# Patient Record
Sex: Female | Born: 1945 | Race: White | Hispanic: No | Smoking: Never smoker
Health system: Southern US, Community
[De-identification: ages and names within clinical notes are randomized; demographics above are authoritative.]

## PROBLEM LIST (undated history)

## (undated) HISTORY — PX: TONSILLECTOMY: SUR1361

## (undated) HISTORY — PX: APPENDECTOMY: SHX54

---

## 2019-09-24 DIAGNOSIS — J189 Pneumonia, unspecified organism: Secondary | ICD-10-CM

## 2019-09-24 DIAGNOSIS — E876 Hypokalemia: Secondary | ICD-10-CM | POA: Diagnosis not present

## 2019-09-24 DIAGNOSIS — U071 COVID-19: Secondary | ICD-10-CM

## 2019-09-24 DIAGNOSIS — R0902 Hypoxemia: Secondary | ICD-10-CM

## 2019-09-24 DIAGNOSIS — E871 Hypo-osmolality and hyponatremia: Secondary | ICD-10-CM

## 2019-09-25 DIAGNOSIS — E876 Hypokalemia: Secondary | ICD-10-CM | POA: Diagnosis not present

## 2019-09-25 DIAGNOSIS — R0902 Hypoxemia: Secondary | ICD-10-CM | POA: Diagnosis not present

## 2019-09-25 DIAGNOSIS — U071 COVID-19: Secondary | ICD-10-CM | POA: Diagnosis not present

## 2019-09-25 DIAGNOSIS — E871 Hypo-osmolality and hyponatremia: Secondary | ICD-10-CM | POA: Diagnosis not present

## 2019-09-26 ENCOUNTER — Other Ambulatory Visit: Payer: Self-pay

## 2019-09-26 ENCOUNTER — Encounter (HOSPITAL_COMMUNITY): Payer: Self-pay | Admitting: Internal Medicine

## 2019-09-26 ENCOUNTER — Inpatient Hospital Stay (HOSPITAL_COMMUNITY)
Admission: AD | Admit: 2019-09-26 | Discharge: 2019-09-29 | DRG: 177 | Disposition: A | Discharge status: Home / Self Care | Payer: Medicare Other | Source: Other Acute Inpatient Hospital | Attending: Internal Medicine | Admitting: Internal Medicine

## 2019-09-26 ENCOUNTER — Inpatient Hospital Stay (HOSPITAL_COMMUNITY): Payer: Medicare Other

## 2019-09-26 DIAGNOSIS — Z6835 Body mass index (BMI) 35.0-35.9, adult: Secondary | ICD-10-CM | POA: Diagnosis not present

## 2019-09-26 DIAGNOSIS — E669 Obesity, unspecified: Secondary | ICD-10-CM | POA: Diagnosis present

## 2019-09-26 DIAGNOSIS — J9601 Acute respiratory failure with hypoxia: Secondary | ICD-10-CM | POA: Diagnosis present

## 2019-09-26 DIAGNOSIS — J1282 Pneumonia due to coronavirus disease 2019: Secondary | ICD-10-CM | POA: Diagnosis present

## 2019-09-26 DIAGNOSIS — E876 Hypokalemia: Secondary | ICD-10-CM | POA: Diagnosis present

## 2019-09-26 DIAGNOSIS — R110 Nausea: Secondary | ICD-10-CM | POA: Diagnosis present

## 2019-09-26 DIAGNOSIS — E871 Hypo-osmolality and hyponatremia: Secondary | ICD-10-CM | POA: Diagnosis present

## 2019-09-26 DIAGNOSIS — R0600 Dyspnea, unspecified: Secondary | ICD-10-CM

## 2019-09-26 DIAGNOSIS — U071 COVID-19: Secondary | ICD-10-CM | POA: Diagnosis present

## 2019-09-26 DIAGNOSIS — E861 Hypovolemia: Secondary | ICD-10-CM | POA: Diagnosis present

## 2019-09-26 DIAGNOSIS — R0902 Hypoxemia: Secondary | ICD-10-CM | POA: Diagnosis not present

## 2019-09-26 LAB — CBC WITH DIFFERENTIAL/PLATELET
Abs Immature Granulocytes: 0.1 10*3/uL — ABNORMAL HIGH (ref 0.00–0.07)
Basophils Absolute: 0 10*3/uL (ref 0.0–0.1)
Basophils Relative: 0 %
Eosinophils Absolute: 0 10*3/uL (ref 0.0–0.5)
Eosinophils Relative: 0 %
HCT: 41.7 % (ref 36.0–46.0)
Hemoglobin: 14.1 g/dL (ref 12.0–15.0)
Immature Granulocytes: 1 %
Lymphocytes Relative: 16 %
Lymphs Abs: 1.1 10*3/uL (ref 0.7–4.0)
MCH: 29.7 pg (ref 26.0–34.0)
MCHC: 33.8 g/dL (ref 30.0–36.0)
MCV: 88 fL (ref 80.0–100.0)
Monocytes Absolute: 0.6 10*3/uL (ref 0.1–1.0)
Monocytes Relative: 8 %
Neutro Abs: 5.2 10*3/uL (ref 1.7–7.7)
Neutrophils Relative %: 75 %
Platelets: 470 10*3/uL — ABNORMAL HIGH (ref 150–400)
RBC: 4.74 MIL/uL (ref 3.87–5.11)
RDW: 12.5 % (ref 11.5–15.5)
WBC: 6.9 10*3/uL (ref 4.0–10.5)
nRBC: 0 % (ref 0.0–0.2)

## 2019-09-26 LAB — COMPREHENSIVE METABOLIC PANEL
ALT: 21 U/L (ref 0–44)
AST: 29 U/L (ref 15–41)
Albumin: 3.1 g/dL — ABNORMAL LOW (ref 3.5–5.0)
Alkaline Phosphatase: 57 U/L (ref 38–126)
Anion gap: 11 (ref 5–15)
BUN: 16 mg/dL (ref 8–23)
CO2: 25 mmol/L (ref 22–32)
Calcium: 8.3 mg/dL — ABNORMAL LOW (ref 8.9–10.3)
Chloride: 97 mmol/L — ABNORMAL LOW (ref 98–111)
Creatinine, Ser: 0.64 mg/dL (ref 0.44–1.00)
GFR calc Af Amer: >60 mL/min (ref >60)
GFR calc non Af Amer: >60 mL/min (ref >60)
Glucose, Bld: 100 mg/dL — ABNORMAL HIGH (ref 70–99)
Potassium: 3.6 mmol/L (ref 3.5–5.1)
Sodium: 133 mmol/L — ABNORMAL LOW (ref 135–145)
Total Bilirubin: 0.4 mg/dL (ref 0.3–1.2)
Total Protein: 6.6 g/dL (ref 6.5–8.1)

## 2019-09-26 LAB — C-REACTIVE PROTEIN: CRP: 7.2 mg/dL — ABNORMAL HIGH (ref <1.0)

## 2019-09-26 LAB — SARS CORONAVIRUS 2 (TAT 6-24 HRS): SARS Coronavirus 2: POSITIVE — AB

## 2019-09-26 LAB — D-DIMER, QUANTITATIVE: D-Dimer, Quant: 0.65 ug/mL-FEU — ABNORMAL HIGH (ref 0.00–0.50)

## 2019-09-26 LAB — ABO/RH: ABO/RH(D): O POS

## 2019-09-26 MED ORDER — ACETAMINOPHEN 325 MG PO TABS: 650.0000 mg | ORAL_TABLET | Freq: Four times a day (QID) | ORAL | Status: DC | PRN

## 2019-09-26 MED ORDER — ENOXAPARIN SODIUM 40 MG/0.4ML ~~LOC~~ SOLN
40.0000 mg | SUBCUTANEOUS | Status: DC
  Administered 2019-09-26 – 2019-09-28 (×3): 40 mg via SUBCUTANEOUS
  Filled 2019-09-26 (×3): qty 0.4

## 2019-09-26 MED ORDER — SODIUM CHLORIDE 0.9 % IV SOLN: 100.0000 mg | Freq: Every day | INTRAVENOUS | Status: DC

## 2019-09-26 MED ORDER — SODIUM CHLORIDE 0.9% IV SOLUTION
Freq: Once | INTRAVENOUS | Status: AC
  Administered 2019-09-26: 10:00:00 10 mL/h via INTRAVENOUS

## 2019-09-26 MED ORDER — SODIUM CHLORIDE 0.9 % IV SOLN: 200.0000 mg | Freq: Once | INTRAVENOUS | Status: DC

## 2019-09-26 MED ORDER — ONDANSETRON HCL 4 MG/2ML IJ SOLN: 4.0000 mg | Freq: Four times a day (QID) | INTRAMUSCULAR | Status: DC | PRN

## 2019-09-26 MED ORDER — ONDANSETRON HCL 4 MG PO TABS: 4.0000 mg | ORAL_TABLET | Freq: Four times a day (QID) | ORAL | Status: DC | PRN

## 2019-09-26 MED ORDER — METHYLPREDNISOLONE SODIUM SUCC 125 MG IJ SOLR
0.5000 mg/kg | Freq: Two times a day (BID) | INTRAMUSCULAR | Status: DC
  Administered 2019-09-26 – 2019-09-28 (×5): 45.625 mg via INTRAVENOUS
  Filled 2019-09-26 (×5): qty 2

## 2019-09-26 MED ORDER — SODIUM CHLORIDE 0.9 % IV SOLN
100.0000 mg | Freq: Every day | INTRAVENOUS | Status: AC
  Administered 2019-09-26 – 2019-09-28 (×3): 100 mg via INTRAVENOUS
  Filled 2019-09-26 (×3): qty 20

## 2019-09-26 MED ORDER — POLYETHYLENE GLYCOL 3350 17 G PO PACK
17.0000 g | PACK | Freq: Every day | ORAL | Status: DC | PRN
  Administered 2019-09-27: 10:00:00 17 g via ORAL
  Filled 2019-09-26: qty 1

## 2019-09-26 NOTE — Plan of Care (Signed)
Care plan initiated.

## 2019-09-26 NOTE — Progress Notes (Signed)
Writer in touch with patient daughter Narda Bonds she has been updated on patient condition per patient request. She did inquire about the purpose of plasma administration, and she has been educated. She states that she understands and is very grateful for the communication.

## 2019-09-26 NOTE — Progress Notes (Addendum)
Pt arrived on 10LNRB with sats @ 89% via EMS from [facility]. On arrival, pt resting comfortably on stretcher. Transferred easily to bed.   Pt now on 15LNRB; vitals taken; CHG bath given, skin check completed with 2nd RN; purewick placed; tele placed and monitor room made aware of pt arrival.  MD in room at this time; will put in orders after assessment.

## 2019-09-26 NOTE — H&P (Signed)
History and Physical    Ashley Montes JJO:[id number] DOB: [birth year] DOA: 09/26/2019  PCP: No primary care provider on file.  Patient coming from: Home  I have personally briefly reviewed patient's old medical records in [facility]  Chief Complaint: Worsening shortness of breath  HPI: Ashley Montes is a 74 y.o. female with no significant past medical history presents to the ED with 1 week of malaise and nausea dry heaving and decreased oral intake secondary to nausea.  Accompanied as she is also relates a dry cough.  She relates she has been having shortness of breath for the past couple of days prior to admission.  She reports a low-grade fever resolved a lot tested positive for COVID-19 last week.  On arrival to the ED at [facility] she was satting 82% on room air was placed on 2 L improved to 94.  She was tachycardic hypotensive with a white count of 7.1 hemoglobin of 13, sodium 130, potassium 3.2 influenza and RSV were negative procalcitonin was 0.05 as her SARS-CoV-2 PCR was positive.  Hospital course: She was admitted on 09/24/2019 due to Covid pneumonia placed on 2 L of oxygen and overnight her oxygen requirements progressively got worse, at the time of transfer she was on 10 L of oxygen, she was started on IV remdesivir and steroids and was given a single dose of Actemra and transferred to [facility] due to deterioration in her respiratory status.  Review of Systems: As per HPI otherwise 10 point review of systems negative.   No past medical history on file.     reports that she has never smoked. She has never used smokeless tobacco. No history on file for alcohol and drug.  Not on File  Family History  Problem Relation Age of Onset  . Heart attack Mother   . Diabetes Mellitus I Mother   . Hypertension Father      Prior to Admission medications   Not on File    Physical Exam: Vitals:   09/26/19 0613 09/26/19 0700 09/26/19 0850  BP: (!) 141/77  140/69    Pulse: 92  93  Resp:   (!) 22  Temp:   98.9 F (37.2 C)  TempSrc:   Oral  SpO2: 98%  90%  Weight:  90.7 kg     Constitutional: NAD, calm, comfortable Vitals:   09/26/19 0613 09/26/19 0700 09/26/19 0850  BP: (!) 141/77  140/69  Pulse: 92  93  Resp:   (!) 22  Temp:   98.9 F (37.2 C)  TempSrc:   Oral  SpO2: 98%  90%  Weight:  90.7 kg    Eyes: PERRL, lids and conjunctivae normal ENMT: Mucous membranes are moist. Posterior pharynx clear of any exudate or lesions.Normal dentition.  Neck: normal, supple, no masses, no thyromegaly Respiratory: Good air movement with crackles at bases bilaterally. Cardiovascular: Regular rate and rhythm, no murmurs / rubs / gallops. No extremity edema. 2+ pedal pulses. No carotid bruits.  Abdomen: no tenderness, no masses palpated. No hepatosplenomegaly. Bowel sounds positive.  Musculoskeletal: no clubbing / cyanosis.  Skin: no rashes, lesions, ulcers. No induration Neurologic: CN 2-12 grossly intact. Sensation intact, DTR normal. Strength 5/5 in all 4.  Psychiatric: Normal judgment and insight. Alert and oriented x 3. Normal mood.    Labs on Admission: I have personally reviewed following labs and imaging studies  CBC: No results for input(s): WBC, NEUTROABS, HGB, HCT, MCV, PLT in the last 168 hours. Basic Metabolic  Panel: No results for input(s): NA, K, CL, CO2, GLUCOSE, BUN, CREATININE, CALCIUM, MG, PHOS in the last 168 hours. GFR: CrCl cannot be calculated (No successful lab value found.). Liver Function Tests: No results for input(s): AST, ALT, ALKPHOS, BILITOT, PROT, ALBUMIN in the last 168 hours. No results for input(s): LIPASE, AMYLASE in the last 168 hours. No results for input(s): AMMONIA in the last 168 hours. Coagulation Profile: No results for input(s): INR, PROTIME in the last 168 hours. Cardiac Enzymes: No results for input(s): CKTOTAL, CKMB, CKMBINDEX, TROPONINI in the last 168 hours. BNP (last 3 results) No results for  input(s): PROBNP in the last 8760 hours. HbA1C: No results for input(s): HGBA1C in the last 72 hours. CBG: No results for input(s): GLUCAP in the last 168 hours. Lipid Profile: No results for input(s): CHOL, HDL, LDLCALC, TRIG, CHOLHDL, LDLDIRECT in the last 72 hours. Thyroid Function Tests: No results for input(s): TSH, T4TOTAL, FREET4, T3FREE, THYROIDAB in the last 72 hours. Anemia Panel: No results for input(s): VITAMINB12, FOLATE, FERRITIN, TIBC, IRON, RETICCTPCT in the last 72 hours. Urine analysis: No results found for: COLORURINE, APPEARANCEUR, LABSPEC, PHURINE, GLUCOSEU, HGBUR, BILIRUBINUR, KETONESUR, PROTEINUR, UROBILINOGEN, NITRITE, LEUKOCYTESUR  Radiological Exams on Admission: No results found.  EKG: Independently reviewed. none  Assessment/Plan Acute respiratory failure with hypoxia ([place]) 2/2  Pneumonia due to COVID-19 virus At [facility] her oxygen requirement worsen to the point where she was requiring 10 L of oxygen to keep her saturations greater than 90%. She was started on IV remdesivir and steroids, she was given 1 dose of Actemra on 09/25/2019. Her procalcitonin [facility] 0.05. He has agreed to convalescent plasma. We will check inflammatory markers, started on IV remdesivir and steroids  Hyponatremia: Likely hypovolemic hyponatremia, she relates significant decrease in oral intake. We will start on gentle IV fluid hydration.   DVT prophylaxis: lovenox Code Status: full Family Communication: none Disposition Plan: When she returns to room air Consults called: none Admission status: Inpatient   Charlynne Cousins MD [facility]  If 7PM-7AM, please contact night-coverage [email or web address] Password [facility]  09/26/2019, 9:43 AM

## 2019-09-27 LAB — CBC WITH DIFFERENTIAL/PLATELET
Abs Immature Granulocytes: 0.27 10*3/uL — ABNORMAL HIGH (ref 0.00–0.07)
Basophils Absolute: 0 10*3/uL (ref 0.0–0.1)
Basophils Relative: 1 %
Eosinophils Absolute: 0 10*3/uL (ref 0.0–0.5)
Eosinophils Relative: 0 %
HCT: 38.6 % (ref 36.0–46.0)
Hemoglobin: 13.1 g/dL (ref 12.0–15.0)
Immature Granulocytes: 5 %
Lymphocytes Relative: 9 %
Lymphs Abs: 0.6 10*3/uL — ABNORMAL LOW (ref 0.7–4.0)
MCH: 30 pg (ref 26.0–34.0)
MCHC: 33.9 g/dL (ref 30.0–36.0)
MCV: 88.5 fL (ref 80.0–100.0)
Monocytes Absolute: 0.3 10*3/uL (ref 0.1–1.0)
Monocytes Relative: 4 %
Neutro Abs: 4.8 10*3/uL (ref 1.7–7.7)
Neutrophils Relative %: 81 %
Platelets: 484 10*3/uL — ABNORMAL HIGH (ref 150–400)
RBC: 4.36 MIL/uL (ref 3.87–5.11)
RDW: 12.4 % (ref 11.5–15.5)
WBC: 5.9 10*3/uL (ref 4.0–10.5)
nRBC: 0 % (ref 0.0–0.2)

## 2019-09-27 LAB — PREPARE FRESH FROZEN PLASMA

## 2019-09-27 LAB — BASIC METABOLIC PANEL
Anion gap: 10 (ref 5–15)
BUN: 17 mg/dL (ref 8–23)
CO2: 28 mmol/L (ref 22–32)
Calcium: 8.5 mg/dL — ABNORMAL LOW (ref 8.9–10.3)
Chloride: 97 mmol/L — ABNORMAL LOW (ref 98–111)
Creatinine, Ser: 0.59 mg/dL (ref 0.44–1.00)
GFR calc Af Amer: >60 mL/min (ref >60)
GFR calc non Af Amer: >60 mL/min (ref >60)
Glucose, Bld: 143 mg/dL — ABNORMAL HIGH (ref 70–99)
Potassium: 4.4 mmol/L (ref 3.5–5.1)
Sodium: 135 mmol/L (ref 135–145)

## 2019-09-27 LAB — BPAM FFP
Blood Product Expiration Date: 202101301108
ISSUE DATE / TIME: 202101291544
Unit Type and Rh: 5100

## 2019-09-27 LAB — C-REACTIVE PROTEIN: CRP: 3.9 mg/dL — ABNORMAL HIGH (ref <1.0)

## 2019-09-27 LAB — D-DIMER, QUANTITATIVE: D-Dimer, Quant: 0.4 ug/mL-FEU (ref 0.00–0.50)

## 2019-09-27 NOTE — Evaluation (Signed)
Physical Therapy Evaluation Patient Details Name: Ashley Montes MRN: [id number] DOB: 09/27/1945 Today's Date: 09/27/2019   History of Present Illness  74 y.o. female with no significant past medical history comes to the ED with 1 week of malaise nausea and dry heaving with significant decreased oral intake, she is tested positive for SARS-CoV-2 in the ED as she had a low-grade fever was found hypoxic placed on 2 L of oxygen now satting 94%  Clinical Impression   Pt admitted with above diagnosis. PTA lived home with family and was very independent. She denied having nor using and DME/AD and states she does not want to have to use any at dc. Pt currently with functional limitations due to the deficits listed below (see PT Problem List). This pm pt did very well with mobility. Pt was able to complete bed mob and transfers with SBA/mod I, was able to ambulate on room air approx 3ft but did desat into 70s, pt took 2 mins to recover to 88%, ambulated on 2L/min same distance and desat to 82% taking 1 min to recover to 92%. Pt will benefit from skilled PT to increase their independence and safety with mobility to allow discharge to the venue listed below.       Follow Up Recommendations No PT follow up    Equipment Recommendations  None recommended by PT    Recommendations for Other Services OT consult     Precautions / Restrictions Precautions Precautions: Fall Restrictions Weight Bearing Restrictions: No      Mobility  Bed Mobility Overal bed mobility: Modified Independent                Transfers Overall transfer level: Needs assistance Equipment used: None Transfers: Sit to/from Stand;Stand Pivot Transfers Sit to Stand: Supervision Stand pivot transfers: Supervision          Ambulation/Gait Ambulation/Gait assistance: Supervision Gait Distance (Feet): 52 Feet Assistive device: None Gait Pattern/deviations: Step-through pattern Gait velocity: decreased       Stairs            Wheelchair Mobility    Modified Rankin (Stroke Patients Only)       Balance Overall balance assessment: No apparent balance deficits (not formally assessed)                                           Pertinent Vitals/Pain Pain Assessment: No/denies pain    Home Living Family/patient expects to be discharged to:: Private residence Living Arrangements: Children;Other relatives Available Help at Discharge: Family Type of Home: House Home Access: Stairs to enter   [employer] of Steps: 3 front door, none back door Home Layout: One level Home Equipment: None      Prior Function Level of Independence: Independent               Hand Dominance   Dominant Hand: Left    Extremity/Trunk Assessment   Upper Extremity Assessment Upper Extremity Assessment: Defer to OT evaluation    Lower Extremity Assessment Lower Extremity Assessment: Generalized weakness    Cervical / Trunk Assessment Cervical / Trunk Assessment: Normal  Communication   Communication: No difficulties  Cognition Arousal/Alertness: Awake/alert Behavior During Therapy: WFL for tasks assessed/performed Overall Cognitive Status: Within Functional Limits for tasks assessed  General Comments      Exercises Other Exercises Other Exercises: flutter valve Other Exercises: incentive spirometer pulls 1225ml Other Exercises: pursed lip breathing    Assessment/Plan    PT Assessment Patient needs continued PT services  PT Problem List Decreased strength;Decreased activity tolerance;Decreased balance;Decreased mobility;Decreased coordination;Decreased safety awareness       PT Treatment Interventions      PT Goals (Current goals can be found in the Care Plan section)  Acute Rehab PT Goals Patient Stated Goal: to go home to family PT Goal Formulation: With patient Time For Goal  Achievement: 10/11/19 Potential to Achieve Goals: Good    Frequency     Barriers to discharge        Co-evaluation               AM-PAC PT "6 Clicks" Mobility  Outcome Measure Help needed turning from your back to your side while in a flat bed without using bedrails?: None Help needed moving from lying on your back to sitting on the side of a flat bed without using bedrails?: None Help needed moving to and from a bed to a chair (including a wheelchair)?: A Little Help needed standing up from a chair using your arms (e.g., wheelchair or bedside chair)?: A Little Help needed to walk in hospital room?: A Little Help needed climbing 3-5 steps with a railing? : A Little 6 Click Score: 20    End of Session Equipment Utilized During Treatment: Oxygen Activity Tolerance: Patient tolerated treatment well Patient left: in chair;with call bell/phone within reach Nurse Communication: Mobility status;Other (comment)(post tx disposition) PT Visit Diagnosis: Other abnormalities of gait and mobility (R26.89);Muscle weakness (generalized) (M62.81)    Time: 7846-9629 PT Time Calculation (min) (ACUTE ONLY): 24 min   Charges:   PT Evaluation $PT Eval Moderate Complexity: 1 Mod PT Treatments $Gait Training: 8-22 mins        Horald Chestnut, PT   Delford Field 09/27/2019, 4:08 PM

## 2019-09-27 NOTE — Progress Notes (Signed)
SATURATION QUALIFICATIONS: (This note is used to comply with regulatory documentation for home oxygen)  Patient Saturations on Room Air at Rest = 94%  Patient Saturations on Room Air while Ambulating = min 77%  Patient Saturations on 2 Liters of oxygen while Ambulating = min 82%  Please briefly explain why patient needs home oxygen: Pt desaturates on room air with mobility and requires supplemental oxygen to maintain a safe saturation level.

## 2019-09-27 NOTE — Progress Notes (Signed)
Spoke with patient's daughter Victorino Dike, updated her on patient's condition and answered any questions she had.

## 2019-09-27 NOTE — Progress Notes (Signed)
[facility] PROGRESS NOTE    Progress Note  Ashley Montes  URK:[id number] DOB: April 10, 1946 DOA: 09/26/2019 PCP: Patient, No Pcp Per     Brief Narrative:   Ashley Montes is an 74 y.o. female with no significant past medical history comes to the ED with 1 week of malaise nausea and dry heaving with significant decreased oral intake, she is tested positive for SARS-CoV-2 in the ED as she had a low-grade fever was found hypoxic placed on 2 L of oxygen now satting 94%  Assessment/Plan:   Acute respiratory failure with hypoxia due to Pneumonia due to COVID-19 virus At [facility] she was requiring 10 L of oxygen to keep saturations greater 90%. She was started on IV remdesivir and steroids she received 1 dose of IV Actemra on 09/24/2018, her procalcitonin was low yield. She is now been weaned to 2.5 L and her saturations have held stable. Inflammatory markers continue to be pending this morning.  Hyponatremia Likely hypovolemic hyponatremia due to significant decreased oral intake,  Resolved with IV fluid hydration.  Hypokalemia Repleted orally now resolved.  RN Pressure Injury Documentation:    Estimated body mass index is 35.42 kg/m as calculated from the following:   Height as of this encounter: 5\' 3"  (1.6 m).   Weight as of this encounter: 90.7 kg.   DVT prophylaxis: Lovenox Family Communication: None Disposition Plan/Barrier to D/C: When she is off oxygen she can go back to skilled nursing facility.  Code Status:     Code Status Orders  (From admission, onward)         Start     Ordered   09/26/19 0711  Full code  Continuous     09/26/19 0711        Code Status History    This patient has a current code status but no historical code status.   Advance Care Planning Activity        IV Access:    Peripheral IV   Procedures and diagnostic studies:   DG CHEST PORT 1 VIEW  Result Date: 09/26/2019 CLINICAL DATA:  Dyspnea.  COVID-19  positive. EXAM: PORTABLE CHEST 1 VIEW COMPARISON:  Chest x-ray dated September 24, 2019. FINDINGS: The heart size and mediastinal contours are within normal limits. Normal pulmonary vascularity. Continued low lung volumes with progressive patchy peripheral and basilar predominant interstitial and airspace opacities in both lungs. No pleural effusion or pneumothorax. No acute osseous abnormality. IMPRESSION: 1. Worsened multifocal pneumonia. Electronically Signed   By: September 26, 2019 M.D.   On: 09/26/2019 11:10     Medical Consultants:    None.  Anti-Infectives:   IV remdesivir  Subjective:    Ashley Montes nonverbal today.  Objective:    Vitals:   09/26/19 2017 09/27/19 0054 09/27/19 0430 09/27/19 0721  BP:  129/65 (!) 146/72 119/67  Pulse: 95 93 94 (!) 110  Resp:  18  19  Temp:  97.9 F (36.6 C) 98.3 F (36.8 C) 98.1 F (36.7 C)  TempSrc:  Oral Oral Oral  SpO2: 93% 97% 93% 90%  Weight:      Height:       SpO2: 90 % O2 Flow Rate (L/min): 2.5 L/min   Intake/Output Summary (Last 24 hours) at 09/27/2019 0729 Last data filed at 09/26/2019 1902 Gross per 24 hour  Intake 1348.68 ml  Output --  Net 1348.68 ml   Filed Weights   09/26/19 0700  Weight: 90.7 kg    Exam: General  exam: In no acute distress. Respiratory system: Good air movement and clear to auscultation. Cardiovascular system: S1 & S2 heard, RRR. No JVD, murmurs, rubs, gallops or clicks.  Gastrointestinal system: Abdomen is nondistended, soft and nontender.  xtremities: No pedal edema. Skin: No rashes, lesions or ulcers   Data Reviewed:    Labs: Basic Metabolic Panel: Recent Labs  Lab 09/26/19 0825  NA 133*  K 3.6  CL 97*  CO2 25  GLUCOSE 100*  BUN 16  CREATININE 0.64  CALCIUM 8.3*   GFR Estimated Creatinine Clearance: 68.9 mL/min (by C-G formula based on SCr of 0.64 mg/dL). Liver Function Tests: Recent Labs  Lab 09/26/19 0825  AST 29  ALT 21  ALKPHOS 57  BILITOT 0.4  PROT 6.6   ALBUMIN 3.1*   No results for input(s): LIPASE, AMYLASE in the last 168 hours. No results for input(s): AMMONIA in the last 168 hours. Coagulation profile No results for input(s): INR, PROTIME in the last 168 hours. COVID-19 Labs  Recent Labs    09/26/19 0825 09/27/19 0225  DDIMER 0.65* 0.40  CRP 7.2* 3.9*    Lab Results  Component Value Date   SARSCOV2NAA POSITIVE (A) 09/26/2019    CBC: Recent Labs  Lab 09/26/19 0825 09/27/19 0225  WBC 6.9 5.9  NEUTROABS 5.2 4.8  HGB 14.1 13.1  HCT 41.7 38.6  MCV 88.0 88.5  PLT 470* 484*   Cardiac Enzymes: No results for input(s): CKTOTAL, CKMB, CKMBINDEX, TROPONINI in the last 168 hours. BNP (last 3 results) No results for input(s): PROBNP in the last 8760 hours. CBG: No results for input(s): GLUCAP in the last 168 hours. D-Dimer: Recent Labs    09/26/19 0825 09/27/19 0225  DDIMER 0.65* 0.40   Hgb A1c: No results for input(s): HGBA1C in the last 72 hours. Lipid Profile: No results for input(s): CHOL, HDL, LDLCALC, TRIG, CHOLHDL, LDLDIRECT in the last 72 hours. Thyroid function studies: No results for input(s): TSH, T4TOTAL, T3FREE, THYROIDAB in the last 72 hours.  Invalid input(s): FREET3 Anemia work up: No results for input(s): VITAMINB12, FOLATE, FERRITIN, TIBC, IRON, RETICCTPCT in the last 72 hours. Sepsis Labs: Recent Labs  Lab 09/26/19 0825 09/27/19 0225  WBC 6.9 5.9   Microbiology Recent Results (from the past 240 hour(s))  SARS CORONAVIRUS 2 (TAT 6-24 HRS) Nasopharyngeal Nasopharyngeal Swab     Status: Abnormal   Collection Time: 09/26/19  9:51 AM   Specimen: Nasopharyngeal Swab  Result Value Ref Range Status   SARS Coronavirus 2 POSITIVE (A) NEGATIVE Final    Comment: RESULT CALLED TO, READ BACK BY AND VERIFIED WITH: T.HARISTON,RN [phone] [id number] I.MANNING (NOTE) SARS-CoV-2 target nucleic acids are DETECTED. The SARS-CoV-2 RNA is generally detectable in upper and lower respiratory specimens during  the acute phase of infection. Positive results are indicative of the presence of SARS-CoV-2 RNA. Clinical correlation with patient history and other diagnostic information is  necessary to determine patient infection status. Positive results do not rule out bacterial infection or co-infection with other viruses.  The expected result is Negative. Fact Sheet for Patients: SugarRoll.be Fact Sheet for Healthcare Providers: https://www.woods-mathews.com/ This test is not yet approved or cleared by the [place] FDA and  has been authorized for detection and/or diagnosis of SARS-CoV-2 by FDA under an Emergency Use Authorization (EUA). This EUA will remain  in effect (meaning this test can be used) for t he duration of the COVID-19 declaration under Section 564(b)(1) of the Act, 21 U.S.C. section 360bbb-3(b)(1), unless the authorization  is terminated or revoked sooner. Performed at [facility], [address] [place], [place] [address]      Medications:   . enoxaparin (LOVENOX) injection  40 mg Subcutaneous Q24H  . methylPREDNISolone (SOLU-MEDROL) injection  0.5 mg/kg Intravenous Q12H   Continuous Infusions: . remdesivir 100 mg in NS 100 mL Stopped (09/26/19 1601)      LOS: 1 day   Marinda Elk  [facility]  09/27/2019, 7:29 AM

## 2019-09-28 LAB — CBC WITH DIFFERENTIAL/PLATELET
Abs Immature Granulocytes: 0.55 10*3/uL — ABNORMAL HIGH (ref 0.00–0.07)
Basophils Absolute: 0 10*3/uL (ref 0.0–0.1)
Basophils Relative: 0 %
Eosinophils Absolute: 0 10*3/uL (ref 0.0–0.5)
Eosinophils Relative: 0 %
HCT: 38.6 % (ref 36.0–46.0)
Hemoglobin: 13.4 g/dL (ref 12.0–15.0)
Immature Granulocytes: 6 %
Lymphocytes Relative: 7 %
Lymphs Abs: 0.7 10*3/uL (ref 0.7–4.0)
MCH: 30.3 pg (ref 26.0–34.0)
MCHC: 34.7 g/dL (ref 30.0–36.0)
MCV: 87.3 fL (ref 80.0–100.0)
Monocytes Absolute: 0.7 10*3/uL (ref 0.1–1.0)
Monocytes Relative: 7 %
Neutro Abs: 7.8 10*3/uL — ABNORMAL HIGH (ref 1.7–7.7)
Neutrophils Relative %: 80 %
Platelets: 585 10*3/uL — ABNORMAL HIGH (ref 150–400)
RBC: 4.42 MIL/uL (ref 3.87–5.11)
RDW: 12.4 % (ref 11.5–15.5)
WBC: 9.8 10*3/uL (ref 4.0–10.5)
nRBC: 0 % (ref 0.0–0.2)

## 2019-09-28 LAB — BASIC METABOLIC PANEL
Anion gap: 9 (ref 5–15)
BUN: 21 mg/dL (ref 8–23)
CO2: 26 mmol/L (ref 22–32)
Calcium: 8.5 mg/dL — ABNORMAL LOW (ref 8.9–10.3)
Chloride: 98 mmol/L (ref 98–111)
Creatinine, Ser: 0.63 mg/dL (ref 0.44–1.00)
GFR calc Af Amer: >60 mL/min (ref >60)
GFR calc non Af Amer: >60 mL/min (ref >60)
Glucose, Bld: 160 mg/dL — ABNORMAL HIGH (ref 70–99)
Potassium: 4.2 mmol/L (ref 3.5–5.1)
Sodium: 133 mmol/L — ABNORMAL LOW (ref 135–145)

## 2019-09-28 LAB — D-DIMER, QUANTITATIVE: D-Dimer, Quant: 0.47 ug/mL-FEU (ref 0.00–0.50)

## 2019-09-28 LAB — C-REACTIVE PROTEIN: CRP: 2.1 mg/dL — ABNORMAL HIGH (ref <1.0)

## 2019-09-28 MED ORDER — ENOXAPARIN SODIUM 40 MG/0.4ML ~~LOC~~ SOLN
40.0000 mg | Freq: Every day | SUBCUTANEOUS | Status: DC
  Administered 2019-09-29: 10:00:00 40 mg via SUBCUTANEOUS
  Filled 2019-09-28: qty 0.4

## 2019-09-28 MED ORDER — SODIUM CHLORIDE 0.9 % IV SOLN: INTRAVENOUS | Status: AC

## 2019-09-28 NOTE — Progress Notes (Signed)
[facility] PROGRESS NOTE    Progress Note  Ashley Montes  DQQ:[id number] DOB: 05-07-1946 DOA: 09/26/2019 PCP: Patient, No Pcp Per     Brief Narrative:   Ashley Montes is an 74 y.o. female with no significant past medical history comes to the ED with 1 week of malaise nausea and dry heaving with significant decreased oral intake, she is tested positive for SARS-CoV-2 in the ED as she had a low-grade fever was found hypoxic placed on 2 L of oxygen now satting 94%  Assessment/Plan:   Acute respiratory failure with hypoxia due to Pneumonia due to COVID-19 virus: She was requiring 3 L of oxygen to keep saturations greater than 90%, she has been weaned down to room air.  Ashley Montes has done extraordinary job proning and using her incentive spirometry and flutter valve. She desaturates with ambulation, but at the time of my evaluation she is already 92% on room air. She is IV remdesivir and steroids, she did receive 1 dose of Actemra on 09/24/2018. Inflammatory laboratory markers are improved.  Hyponatremia Likely hypovolemic hyponatremia due to significant decreased oral intake,  Continue IV fluids for the next 24 hours.  Hypokalemia Repleted orally now resolved.  RN Pressure Injury Documentation:    Estimated body mass index is 35.42 kg/m as calculated from the following:   Height as of this encounter: 5\' 3"  (1.6 m).   Weight as of this encounter: 90.7 kg.   DVT prophylaxis: Lovenox Family Communication: None Disposition Plan/Barrier to D/C: When she is off oxygen she can go back to skilled nursing facility.  Code Status:     Code Status Orders  (From admission, onward)         Start     Ordered   09/26/19 0711  Full code  Continuous     09/26/19 0711        Code Status History    This patient has a current code status but no historical code status.   Advance Care Planning Activity        IV Access:    Peripheral IV   Procedures and diagnostic  studies:   DG CHEST PORT 1 VIEW  Result Date: 09/26/2019 CLINICAL DATA:  Dyspnea.  COVID-19 positive. EXAM: PORTABLE CHEST 1 VIEW COMPARISON:  Chest x-ray dated September 24, 2019. FINDINGS: The heart size and mediastinal contours are within normal limits. Normal pulmonary vascularity. Continued low lung volumes with progressive patchy peripheral and basilar predominant interstitial and airspace opacities in both lungs. No pleural effusion or pneumothorax. No acute osseous abnormality. IMPRESSION: 1. Worsened multifocal pneumonia. Electronically Signed   By: September 26, 2019 M.D.   On: 09/26/2019 11:10     Medical Consultants:    None.  Anti-Infectives:   IV remdesivir  Subjective:    [name] no complaints today she relates she feels great.  Objective:    Vitals:   09/27/19 2010 09/27/19 2333 09/27/19 2337 09/28/19 0437  BP: 128/62 (!) 141/70  124/62  Pulse: 95 (!) 111 91 70  Resp: 20   18  Temp: 97.9 F (36.6 C)   98.3 F (36.8 C)  TempSrc: Oral   Oral  SpO2: 92% (!) 82% 92% 92%  Weight:      Height:       SpO2: 92 % O2 Flow Rate (L/min): 2.5 L/min   Intake/Output Summary (Last 24 hours) at 09/28/2019 0728 Last data filed at 09/27/2019 1048 Gross per 24 hour  Intake 100 ml  Output 400 ml  Net -300 ml   Filed Weights   09/26/19 0700  Weight: 90.7 kg    Exam: General exam: In no acute distress. Respiratory system: Good air movement and clear to auscultation. Cardiovascular system: S1 & S2 heard, RRR. No JVD. Gastrointestinal system: Abdomen is nondistended, soft and nontender.  Extremities: No pedal edema. Skin: No rashes, lesions or ulcers  Data Reviewed:    Labs: Basic Metabolic Panel: Recent Labs  Lab 09/26/19 0825 09/26/19 0825 09/27/19 0225 09/28/19 0125  NA 133*  --  135 133*  K 3.6   < > 4.4 4.2  CL 97*  --  97* 98  CO2 25  --  28 26  GLUCOSE 100*  --  143* 160*  BUN 16  --  17 21  CREATININE 0.64  --  0.59 0.63  CALCIUM 8.3*  --   8.5* 8.5*   < > = values in this interval not displayed.   GFR Estimated Creatinine Clearance: 68.9 mL/min (by C-G formula based on SCr of 0.63 mg/dL). Liver Function Tests: Recent Labs  Lab 09/26/19 0825  AST 29  ALT 21  ALKPHOS 57  BILITOT 0.4  PROT 6.6  ALBUMIN 3.1*   No results for input(s): LIPASE, AMYLASE in the last 168 hours. No results for input(s): AMMONIA in the last 168 hours. Coagulation profile No results for input(s): INR, PROTIME in the last 168 hours. COVID-19 Labs  Recent Labs    09/26/19 0825 09/27/19 0225 09/28/19 0125  DDIMER 0.65* 0.40 0.47  CRP 7.2* 3.9* 2.1*    Lab Results  Component Value Date   SARSCOV2NAA POSITIVE (A) 09/26/2019    CBC: Recent Labs  Lab 09/26/19 0825 09/27/19 0225 09/28/19 0125  WBC 6.9 5.9 9.8  NEUTROABS 5.2 4.8 7.8*  HGB 14.1 13.1 13.4  HCT 41.7 38.6 38.6  MCV 88.0 88.5 87.3  PLT 470* 484* 585*   Cardiac Enzymes: No results for input(s): CKTOTAL, CKMB, CKMBINDEX, TROPONINI in the last 168 hours. BNP (last 3 results) No results for input(s): PROBNP in the last 8760 hours. CBG: No results for input(s): GLUCAP in the last 168 hours. D-Dimer: Recent Labs    09/27/19 0225 09/28/19 0125  DDIMER 0.40 0.47   Hgb A1c: No results for input(s): HGBA1C in the last 72 hours. Lipid Profile: No results for input(s): CHOL, HDL, LDLCALC, TRIG, CHOLHDL, LDLDIRECT in the last 72 hours. Thyroid function studies: No results for input(s): TSH, T4TOTAL, T3FREE, THYROIDAB in the last 72 hours.  Invalid input(s): FREET3 Anemia work up: No results for input(s): VITAMINB12, FOLATE, FERRITIN, TIBC, IRON, RETICCTPCT in the last 72 hours. Sepsis Labs: Recent Labs  Lab 09/26/19 0825 09/27/19 0225 09/28/19 0125  WBC 6.9 5.9 9.8   Microbiology Recent Results (from the past 240 hour(s))  SARS CORONAVIRUS 2 (TAT 6-24 HRS) Nasopharyngeal Nasopharyngeal Swab     Status: Abnormal   Collection Time: 09/26/19  9:51 AM    Specimen: Nasopharyngeal Swab  Result Value Ref Range Status   SARS Coronavirus 2 POSITIVE (A) NEGATIVE Final    Comment: RESULT CALLED TO, READ BACK BY AND VERIFIED WITH: T.HARISTON,RN [phone] [id number] I.MANNING (NOTE) SARS-CoV-2 target nucleic acids are DETECTED. The SARS-CoV-2 RNA is generally detectable in upper and lower respiratory specimens during the acute phase of infection. Positive results are indicative of the presence of SARS-CoV-2 RNA. Clinical correlation with patient history and other diagnostic information is  necessary to determine patient infection status. Positive results do not rule out  bacterial infection or co-infection with other viruses.  The expected result is Negative. Fact Sheet for Patients: SugarRoll.be Fact Sheet for Healthcare Providers: https://www.woods-mathews.com/ This test is not yet approved or cleared by the [place] FDA and  has been authorized for detection and/or diagnosis of SARS-CoV-2 by FDA under an Emergency Use Authorization (EUA). This EUA will remain  in effect (meaning this test can be used) for t he duration of the COVID-19 declaration under Section 564(b)(1) of the Act, 21 U.S.C. section 360bbb-3(b)(1), unless the authorization is terminated or revoked sooner. Performed at [facility], [place] [address], [place], [place] [address]      Medications:   . enoxaparin (LOVENOX) injection  40 mg Subcutaneous Q24H  . methylPREDNISolone (SOLU-MEDROL) injection  0.5 mg/kg Intravenous Q12H   Continuous Infusions: . remdesivir 100 mg in NS 100 mL Stopped (09/27/19 1048)      LOS: 2 days   Charlynne Cousins  [facility]  09/28/2019, 7:28 AM

## 2019-09-28 NOTE — Progress Notes (Signed)
Walked with the patient in the hall today. We went about 35ft down the hall and the patient needed a short rest due to feeling short of breath. We rested for about 2 minutes then walked back to her room for a total walk of about 163ft. She was on room air for the duration of the walk and her O2 saturation remained 87% and higher.

## 2019-09-29 LAB — CBC WITH DIFFERENTIAL/PLATELET
Abs Immature Granulocytes: 0.76 10*3/uL — ABNORMAL HIGH (ref 0.00–0.07)
Basophils Absolute: 0.1 10*3/uL (ref 0.0–0.1)
Basophils Relative: 1 %
Eosinophils Absolute: 0 10*3/uL (ref 0.0–0.5)
Eosinophils Relative: 0 %
HCT: 37.9 % (ref 36.0–46.0)
Hemoglobin: 12.9 g/dL (ref 12.0–15.0)
Immature Granulocytes: 7 %
Lymphocytes Relative: 12 %
Lymphs Abs: 1.2 10*3/uL (ref 0.7–4.0)
MCH: 30.1 pg (ref 26.0–34.0)
MCHC: 34 g/dL (ref 30.0–36.0)
MCV: 88.6 fL (ref 80.0–100.0)
Monocytes Absolute: 0.9 10*3/uL (ref 0.1–1.0)
Monocytes Relative: 9 %
Neutro Abs: 7.7 10*3/uL (ref 1.7–7.7)
Neutrophils Relative %: 71 %
Platelets: 604 10*3/uL — ABNORMAL HIGH (ref 150–400)
RBC: 4.28 MIL/uL (ref 3.87–5.11)
RDW: 12.7 % (ref 11.5–15.5)
WBC: 10.7 10*3/uL — ABNORMAL HIGH (ref 4.0–10.5)
nRBC: 0 % (ref 0.0–0.2)

## 2019-09-29 LAB — BASIC METABOLIC PANEL
Anion gap: 10 (ref 5–15)
BUN: 23 mg/dL (ref 8–23)
CO2: 25 mmol/L (ref 22–32)
Calcium: 8.2 mg/dL — ABNORMAL LOW (ref 8.9–10.3)
Chloride: 98 mmol/L (ref 98–111)
Creatinine, Ser: 0.71 mg/dL (ref 0.44–1.00)
GFR calc Af Amer: >60 mL/min (ref >60)
GFR calc non Af Amer: >60 mL/min (ref >60)
Glucose, Bld: 85 mg/dL (ref 70–99)
Potassium: 4.3 mmol/L (ref 3.5–5.1)
Sodium: 133 mmol/L — ABNORMAL LOW (ref 135–145)

## 2019-09-29 LAB — D-DIMER, QUANTITATIVE: D-Dimer, Quant: 0.71 ug/mL-FEU — ABNORMAL HIGH (ref 0.00–0.50)

## 2019-09-29 LAB — C-REACTIVE PROTEIN: CRP: 1.1 mg/dL — ABNORMAL HIGH (ref <1.0)

## 2019-09-29 MED ORDER — PREDNISONE 10 MG PO TABS: ORAL_TABLET | ORAL | 0 refills | Status: AC

## 2019-09-29 NOTE — Discharge Instructions (Signed)
COVID-19 COVID-19 is a respiratory infection that is caused by a virus called severe acute respiratory syndrome coronavirus 2 (SARS-CoV-2). The disease is also known as coronavirus disease or novel coronavirus. In some people, the virus may not cause any symptoms. In others, it may cause a serious infection. The infection can get worse quickly and can lead to complications, such as:  Pneumonia, or infection of the lungs.  Acute respiratory distress syndrome or ARDS. This is a condition in which fluid build-up in the lungs prevents the lungs from filling with air and passing oxygen into the blood.  Acute respiratory failure. This is a condition in which there is not enough oxygen passing from the lungs to the body or when carbon dioxide is not passing from the lungs out of the body.  Sepsis or septic shock. This is a serious bodily reaction to an infection.  Blood clotting problems.  Secondary infections due to bacteria or fungus.  Organ failure. This is when your body's organs stop working. The virus that causes COVID-19 is contagious. This means that it can spread from person to person through droplets from coughs and sneezes (respiratory secretions). What are the causes? This illness is caused by a virus. You may catch the virus by:  Breathing in droplets from an infected person. Droplets can be spread by a person breathing, speaking, singing, coughing, or sneezing.  Touching something, like a table or a doorknob, that was exposed to the virus (contaminated) and then touching your mouth, nose, or eyes. What increases the risk? Risk for infection You are more likely to be infected with this virus if you:  Are within 6 feet (2 meters) of a person with COVID-19.  Provide care for or live with a person who is infected with COVID-19.  Spend time in crowded indoor spaces or live in shared housing. Risk for serious illness You are more likely to become seriously ill from the virus if you:   Are 50 years of age or older. The higher your age, the more you are at risk for serious illness.  Live in a nursing home or long-term care facility.  Have cancer.  Have a long-term (chronic) disease such as: ? Chronic lung disease, including chronic obstructive pulmonary disease or asthma. ? A long-term disease that lowers your body's ability to fight infection (immunocompromised). ? Heart disease, including heart failure, a condition in which the arteries that lead to the heart become narrow or blocked (coronary artery disease), a disease which makes the heart muscle thick, weak, or stiff (cardiomyopathy). ? Diabetes. ? Chronic kidney disease. ? Sickle cell disease, a condition in which red blood cells have an abnormal "sickle" shape. ? Liver disease.  Are obese. What are the signs or symptoms? Symptoms of this condition can range from mild to severe. Symptoms may appear any time from 2 to 14 days after being exposed to the virus. They include:  A fever or chills.  A cough.  Difficulty breathing.  Headaches, body aches, or muscle aches.  Runny or stuffy (congested) nose.  A sore throat.  New loss of taste or smell. Some people may also have stomach problems, such as nausea, vomiting, or diarrhea. Other people may not have any symptoms of COVID-19. How is this diagnosed? This condition may be diagnosed based on:  Your signs and symptoms, especially if: ? You live in an area with a COVID-19 outbreak. ? You recently traveled to or from an area where the virus is common. ? You   provide care for or live with a person who was diagnosed with COVID-19. ? You were exposed to a person who was diagnosed with COVID-19.  A physical exam.  Lab tests, which may include: ? Taking a sample of fluid from the back of your nose and throat (nasopharyngeal fluid), your nose, or your throat using a swab. ? A sample of mucus from your lungs (sputum). ? Blood tests.  Imaging tests, which  may include, X-rays, CT scan, or ultrasound. How is this treated? At present, there is no medicine to treat COVID-19. Medicines that treat other diseases are being used on a trial basis to see if they are effective against COVID-19. Your health care provider will talk with you about ways to treat your symptoms. For most people, the infection is mild and can be managed at home with rest, fluids, and over-the-counter medicines. Treatment for a serious infection usually takes places in a hospital intensive care unit (ICU). It may include one or more of the following treatments. These treatments are given until your symptoms improve.  Receiving fluids and medicines through an IV.  Supplemental oxygen. Extra oxygen is given through a tube in the nose, a face mask, or a hood.  Positioning you to lie on your stomach (prone position). This makes it easier for oxygen to get into the lungs.  Continuous positive airway pressure (CPAP) or bi-level positive airway pressure (BPAP) machine. This treatment uses mild air pressure to keep the airways open. A tube that is connected to a motor delivers oxygen to the body.  Ventilator. This treatment moves air into and out of the lungs by using a tube that is placed in your windpipe.  Tracheostomy. This is a procedure to create a hole in the neck so that a breathing tube can be inserted.  Extracorporeal membrane oxygenation (ECMO). This procedure gives the lungs a chance to recover by taking over the functions of the heart and lungs. It supplies oxygen to the body and removes carbon dioxide. Follow these instructions at home: Lifestyle  If you are sick, stay home except to get medical care. Your health care provider will tell you how long to stay home. Call your health care provider before you go for medical care.  Rest at home as told by your health care provider.  Do not use any products that contain nicotine or tobacco, such as cigarettes, e-cigarettes, and  chewing tobacco. If you need help quitting, ask your health care provider.  Return to your normal activities as told by your health care provider. Ask your health care provider what activities are safe for you. General instructions  Take over-the-counter and prescription medicines only as told by your health care provider.  Drink enough fluid to keep your urine pale yellow.  Keep all follow-up visits as told by your health care provider. This is important. How is this prevented?  There is no vaccine to help prevent COVID-19 infection. However, there are steps you can take to protect yourself and others from this virus. To protect yourself:   Do not travel to areas where COVID-19 is a risk. The areas where COVID-19 is reported change often. To identify high-risk areas and travel restrictions, check the CDC travel website: wwwnc.cdc.gov/travel/notices  If you live in, or must travel to, an area where COVID-19 is a risk, take precautions to avoid infection. ? Stay away from people who are sick. ? Wash your hands often with soap and water for 20 seconds. If soap and water   are not available, use an alcohol-based hand sanitizer. ? Avoid touching your mouth, face, eyes, or nose. ? Avoid going out in public, follow guidance from your state and local health authorities. ? If you must go out in public, wear a cloth face covering or face mask. Make sure your mask covers your nose and mouth. ? Avoid crowded indoor spaces. Stay at least 6 feet (2 meters) away from others. ? Disinfect objects and surfaces that are frequently touched every day. This may include:  Counters and tables.  Doorknobs and light switches.  Sinks and faucets.  Electronics, such as phones, remote controls, keyboards, computers, and tablets. To protect others: If you have symptoms of COVID-19, take steps to prevent the virus from spreading to others.  If you think you have a COVID-19 infection, contact your health care  provider right away. Tell your health care team that you think you may have a COVID-19 infection.  Stay home. Leave your house only to seek medical care. Do not use public transport.  Do not travel while you are sick.  Wash your hands often with soap and water for 20 seconds. If soap and water are not available, use alcohol-based hand sanitizer.  Stay away from other members of your household. Let healthy household members care for children and pets, if possible. If you have to care for children or pets, wash your hands often and wear a mask. If possible, stay in your own room, separate from others. Use a different bathroom.  Make sure that all people in your household wash their hands well and often.  Cough or sneeze into a tissue or your sleeve or elbow. Do not cough or sneeze into your hand or into the air.  Wear a cloth face covering or face mask. Make sure your mask covers your nose and mouth. Where to find more information  Centers for Disease Control and Prevention: www.cdc.gov/coronavirus/2019-ncov/index.html  World Health Organization: www.who.int/health-topics/coronavirus Contact a health care provider if:  You live in or have traveled to an area where COVID-19 is a risk and you have symptoms of the infection.  You have had contact with someone who has COVID-19 and you have symptoms of the infection. Get help right away if:  You have trouble breathing.  You have pain or pressure in your chest.  You have confusion.  You have bluish lips and fingernails.  You have difficulty waking from sleep.  You have symptoms that get worse. These symptoms may represent a serious problem that is an emergency. Do not wait to see if the symptoms will go away. Get medical help right away. Call your local emergency services (911 in the [place] Do not drive yourself to the hospital. Let the emergency medical personnel know if you think you have COVID-19. Summary  COVID-19 is a  respiratory infection that is caused by a virus. It is also known as coronavirus disease or novel coronavirus. It can cause serious infections, such as pneumonia, acute respiratory distress syndrome, acute respiratory failure, or sepsis.  The virus that causes COVID-19 is contagious. This means that it can spread from person to person through droplets from breathing, speaking, singing, coughing, or sneezing.  You are more likely to develop a serious illness if you are 50 years of age or older, have a weak immune system, live in a nursing home, or have chronic disease.  There is no medicine to treat COVID-19. Your health care provider will talk with you about ways to treat your symptoms.    Take steps to protect yourself and others from infection. Wash your hands often and disinfect objects and surfaces that are frequently touched every day. Stay away from people who are sick and wear a mask if you are sick. This information is not intended to replace advice given to you by your health care provider. Make sure you discuss any questions you have with your health care provider. Document Revised: 06/13/2019 Document Reviewed: 09/19/2018 Elsevier Patient Education  2020 Elsevier Inc.  

## 2019-09-29 NOTE — Progress Notes (Signed)
Physical Therapy Treatment Patient Details Name: Ashley Montes MRN: [id number] DOB: Jan 19, 1946 Today's Date: 09/29/2019    History of Present Illness 74 y.o. female with no significant past medical history comes to the ED with 1 week of malaise nausea and dry heaving with significant decreased oral intake, she is tested positive for SARS-CoV-2 in the ED.    PT Comments    Pt continues to require supplemental O2 with activity. Pt ready for dc home from PT standpoint.    Follow Up Recommendations  No PT follow up     Equipment Recommendations  None recommended by PT    Recommendations for Other Services       Precautions / Restrictions Precautions Precautions: None Restrictions Weight Bearing Restrictions: No    Mobility  Bed Mobility               General bed mobility comments: Pt up in chair  Transfers Overall transfer level: Modified independent Equipment used: None Transfers: Sit to/from Stand;Stand Pivot Transfers Sit to Stand: Modified independent (Device/Increase time)            Ambulation/Gait Ambulation/Gait assistance: Supervision Gait Distance (Feet): 150 Feet Assistive device: None Gait Pattern/deviations: Step-through pattern;Decreased stride length Gait velocity: decreased Gait velocity interpretation: 1.31 - 2.62 ft/sec, indicative of limited community ambulator General Gait Details: Steady gait with supervision for lines and to monitor SpO2   Stairs             Wheelchair Mobility    Modified Rankin (Stroke Patients Only)       Balance Overall balance assessment: No apparent balance deficits (not formally assessed)                                          Cognition Arousal/Alertness: Awake/alert Behavior During Therapy: WFL for tasks assessed/performed Overall Cognitive Status: Within Functional Limits for tasks assessed                                        Exercises       General Comments General comments (skin integrity, edema, etc.): Pt 95% on RA. SpO2 85% with amb on RA and 88% on 2L.       Pertinent Vitals/Pain Pain Assessment: No/denies pain    Home Living                      Prior Function            PT Goals (current goals can now be found in the care plan section) Acute Rehab PT Goals Patient Stated Goal: go home Progress towards PT goals: Progressing toward goals    Frequency           PT Plan Current plan remains appropriate    Co-evaluation              AM-PAC PT "6 Clicks" Mobility   Outcome Measure  Help needed turning from your back to your side while in a flat bed without using bedrails?: None Help needed moving from lying on your back to sitting on the side of a flat bed without using bedrails?: None Help needed moving to and from a bed to a chair (including a wheelchair)?: None Help needed standing up from a chair using your arms (e.g.,  wheelchair or bedside chair)?: None Help needed to walk in hospital room?: None Help needed climbing 3-5 steps with a railing? : A Little 6 Click Score: 23    End of Session Equipment Utilized During Treatment: Oxygen Activity Tolerance: Patient tolerated treatment well Patient left: in chair;with call bell/phone within reach Nurse Communication: Mobility status;Other (comment)(SpO2 levels) PT Visit Diagnosis: Other abnormalities of gait and mobility (R26.89);Muscle weakness (generalized) (M62.81)     Time: 4301-4840 PT Time Calculation (min) (ACUTE ONLY): 16 min  Charges:  $Gait Training: 8-22 mins                     [facility] PT Acute Rehabilitation Services Pager [phone] Office [phone]    Ashley Montes [facility] 09/29/2019, 10:06 AM

## 2019-09-29 NOTE — TOC Progression Note (Signed)
Transition of Care [facility]) - Progression Note    Patient Details  Name: SLYVIA LARTIGUE MRN: [id number] Date of Birth: 1945-11-15  Transition of Care [facility]) CM/SW Contact  Armanda Heritage, RN Phone Number: 09/29/2019, 10:07 AM  Clinical Narrative:    Patient set up with [facility] for home oxygen. [employer] to deliver concentrator to home, [facility] to deliver portable tank to bedside.     Expected Discharge Plan: Home/Self Care Barriers to Discharge: No Barriers Identified  Expected Discharge Plan and Services Expected Discharge Plan: Home/Self Care   Discharge Planning Services: CM Consult Post Acute Care Choice: Durable Medical Equipment Living arrangements for the past 2 months: Single Family Home Expected Discharge Date: 09/29/19               DME Arranged: Oxygen DME Agency: [facility] Date DME Agency Contacted: 09/29/19 Time DME Agency Contacted: 1007 Representative spoke with at DME Agency: [facility] HH Arranged: NA HH Agency: NA         Social Determinants of Health (SDOH) Interventions    Readmission Risk Interventions No flowsheet data found.

## 2019-09-29 NOTE — Discharge Summary (Addendum)
Physician Discharge Summary  TAYNA SMETHURST WVP:[id number] DOB: 01/07/1946 DOA: 09/26/2019  PCP: Patient, No Pcp Per  Admit date: 09/26/2019 Discharge date: 09/29/2019  Admitted From: Home Disposition:  Home  Recommendations for Outpatient Follow-up:  Follow up with PCP in 1-2 weeks She will go home on 2 L of oxygen as she desatted with ambulation she will follow-up with PCP and we can wean her off the oxygen.  Home Health:No Equipment/Devices:None  Discharge Condition:Stable CODE STATUS:Full Diet recommendation: Heart Healthy  Brief/Interim Summary: 74 y.o. female with no significant past medical history comes to the ED with 1 week of malaise nausea and dry heaving with significant decreased oral intake, she is tested positive for SARS-CoV-2 in the ED as she had a low-grade fever was found hypoxic placed on 2 L of oxygen now satting 94%   Discharge Diagnoses:  Active Problems:   Acute respiratory failure with hypoxia (HCC)   Pneumonia due to COVID-19 virus   Hyponatremia   Hypokalemia Acute respiratory failure with hypoxia secondary to pneumonia due to COVID-19: On admission she had to be placed on oxygen, she was started on IV remdesivir and IV steroids she did an outstanding job proning, using incentive spirometry and flutter valve. She was started on IV remdesivir and steroids, she will go home on a steroid taper. Work with physical therapy she has no PT follow-ups.  Mild hypovolemic hyponatremia: Likely due to decreased oral intake and resolved with IV fluid hydration.  Mild Hypokalemia: Does not was as low as 3.6 he was repleted orally.  Obesity: Noted counseled.  Discharge Instructions  Discharge Instructions     Diet - low sodium heart healthy   Complete by: As directed    Increase activity slowly   Complete by: As directed    MyChart COVID-19 home monitoring program   Complete by: Sep 29, 2019    Is the patient willing to use the [place] for home  monitoring?: Yes   Temperature monitoring   Complete by: Sep 29, 2019    After how many days would you like to receive a notification of this patient's flowsheet entries?: 1      Allergies as of 09/29/2019   No Known Allergies      Medication List     TAKE these medications    Advil PM 200-25 MG Caps Generic drug: Ibuprofen-diphenhydrAMINE HCl Take 2 tablets by mouth at bedtime.   ibuprofen 200 MG tablet Commonly known as: ADVIL Take 400 mg by mouth every 6 (six) hours as needed for headache or moderate pain.   predniSONE 10 MG tablet Commonly known as: DELTASONE Takes 6 tablets for 1 days, then 5 tablets for 1 days, then 4 tablets for 1 days, then 3 tablets for 1 days, then 2 tabs for 1 days, then 1 tab for 1 days, and then stop.   Vicks Sinex 12 Hour Decongest 0.05 % nasal spray Generic drug: oxymetazoline Place 1 spray into both nostrils daily as needed for congestion.               Durable Medical Equipment  (From admission, onward)           Start     Ordered   09/29/19 0650  DME Oxygen  Once    Question Answer Comment  Length of Need 6 Months   Mode or (Route) Nasal cannula   Liters per Minute 2   Oxygen delivery system Gas      09/29/19 [phone]  No Known Allergies  Consultations: None   Procedures/Studies: DG CHEST PORT 1 VIEW  Result Date: 09/26/2019 CLINICAL DATA:  Dyspnea.  COVID-19 positive. EXAM: PORTABLE CHEST 1 VIEW COMPARISON:  Chest x-ray dated September 24, 2019. FINDINGS: The heart size and mediastinal contours are within normal limits. Normal pulmonary vascularity. Continued low lung volumes with progressive patchy peripheral and basilar predominant interstitial and airspace opacities in both lungs. No pleural effusion or pneumothorax. No acute osseous abnormality. IMPRESSION: 1. Worsened multifocal pneumonia. Electronically Signed   By: Obie Dredge M.D.   On: 09/26/2019 11:10      Subjective: Complaints.  Discharge Exam: Vitals:   09/29/19 0425 09/29/19 0725  BP:  123/76  Pulse: 72   Resp:  16  Temp:  (!) 97.4 F (36.3 C)  SpO2: 91%    Vitals:   09/28/19 2005 09/29/19 0414 09/29/19 0425 09/29/19 0725  BP: 136/68 (!) 148/71  123/76  Pulse:  (!) 113 72   Resp: 16 20  16   Temp: 98.2 F (36.8 C) 98.9 F (37.2 C)  (!) 97.4 F (36.3 C)  TempSrc: Oral Oral  Oral  SpO2: 92% 90% 91%   Weight:      Height:        General: Pt is alert, awake, not in acute distress Cardiovascular: RRR, S1/S2 +, no rubs, no gallops Respiratory: CTA bilaterally, no wheezing, no rhonchi Abdominal: Soft, NT, ND, bowel sounds + Extremities: no edema, no cyanosis    The results of significant diagnostics from this hospitalization (including imaging, microbiology, ancillary and laboratory) are listed below for reference.     Microbiology: Recent Results (from the past 240 hour(s))  SARS CORONAVIRUS 2 (TAT 6-24 HRS) Nasopharyngeal Nasopharyngeal Swab     Status: Abnormal   Collection Time: 09/26/19  9:51 AM   Specimen: Nasopharyngeal Swab  Result Value Ref Range Status   SARS Coronavirus 2 POSITIVE (A) NEGATIVE Final    Comment: RESULT CALLED TO, READ BACK BY AND VERIFIED WITH: T.HARISTON,RN [phone] I.MANNING (NOTE) SARS-CoV-2 target nucleic acids are DETECTED. The SARS-CoV-2 RNA is generally detectable in upper and lower respiratory specimens during the acute phase of infection. Positive results are indicative of the presence of SARS-CoV-2 RNA. Clinical correlation with patient history and other diagnostic information is  necessary to determine patient infection status. Positive results do not rule out bacterial infection or co-infection with other viruses.  The expected result is Negative. Fact Sheet for Patients: 25427062 Fact Sheet for Healthcare Providers: HairSlick.no This test is not yet  approved or cleared by the [place] FDA and  has been authorized for detection and/or diagnosis of SARS-CoV-2 by FDA under an Emergency Use Authorization (EUA). This EUA will remain  in effect (meaning this test can be used) for t he duration of the COVID-19 declaration under Section 564(b)(1) of the Act, 21 U.S.C. section 360bbb-3(b)(1), unless the authorization is terminated or revoked sooner. Performed at [facility], [address] [place], [place]      Labs: BNP (last 3 results) No results for input(s): BNP in the last 8760 hours. Basic Metabolic Panel: Recent Labs  Lab 09/26/19 0825 09/27/19 0225 09/28/19 0125 09/29/19 0226  NA 133* 135 133* 133*  K 3.6 4.4 4.2 4.3  CL 97* 97* 98 98  CO2 25 28 26 25   GLUCOSE 100* 143* 160* 85  BUN 16 17 21 23   CREATININE 0.64 0.59 0.63 0.71  CALCIUM 8.3* 8.5* 8.5* 8.2*   Liver Function  Tests: Recent Labs  Lab 09/26/19 0825  AST 29  ALT 21  ALKPHOS 57  BILITOT 0.4  PROT 6.6  ALBUMIN 3.1*   No results for input(s): LIPASE, AMYLASE in the last 168 hours. No results for input(s): AMMONIA in the last 168 hours. CBC: Recent Labs  Lab 09/26/19 0825 09/27/19 0225 09/28/19 0125 09/29/19 0226  WBC 6.9 5.9 9.8 10.7*  NEUTROABS 5.2 4.8 7.8* 7.7  HGB 14.1 13.1 13.4 12.9  HCT 41.7 38.6 38.6 37.9  MCV 88.0 88.5 87.3 88.6  PLT 470* 484* 585* 604*   Cardiac Enzymes: No results for input(s): CKTOTAL, CKMB, CKMBINDEX, TROPONINI in the last 168 hours. BNP: Invalid input(s): POCBNP CBG: No results for input(s): GLUCAP in the last 168 hours. D-Dimer Recent Labs    09/28/19 0125 09/29/19 0226  DDIMER 0.47 0.71*   Hgb A1c No results for input(s): HGBA1C in the last 72 hours. Lipid Profile No results for input(s): CHOL, HDL, LDLCALC, TRIG, CHOLHDL, LDLDIRECT in the last 72 hours. Thyroid function studies No results for input(s): TSH, T4TOTAL, T3FREE, THYROIDAB in the last 72 hours.  Invalid input(s):  FREET3 Anemia work up No results for input(s): VITAMINB12, FOLATE, FERRITIN, TIBC, IRON, RETICCTPCT in the last 72 hours. Urinalysis No results found for: COLORURINE, APPEARANCEUR, LABSPEC, PHURINE, GLUCOSEU, HGBUR, BILIRUBINUR, KETONESUR, PROTEINUR, UROBILINOGEN, NITRITE, LEUKOCYTESUR Sepsis Labs Invalid input(s): PROCALCITONIN,  WBC,  LACTICIDVEN Microbiology Recent Results (from the past 240 hour(s))  SARS CORONAVIRUS 2 (TAT 6-24 HRS) Nasopharyngeal Nasopharyngeal Swab     Status: Abnormal   Collection Time: 09/26/19  9:51 AM   Specimen: Nasopharyngeal Swab  Result Value Ref Range Status   SARS Coronavirus 2 POSITIVE (A) NEGATIVE Final    Comment: RESULT CALLED TO, READ BACK BY AND VERIFIED WITH: T.HARISTON,RN [id number] I.MANNING (NOTE) SARS-CoV-2 target nucleic acids are DETECTED. The SARS-CoV-2 RNA is generally detectable in upper and lower respiratory specimens during the acute phase of infection. Positive results are indicative of the presence of SARS-CoV-2 RNA. Clinical correlation with patient history and other diagnostic information is  necessary to determine patient infection status. Positive results do not rule out bacterial infection or co-infection with other viruses.  The expected result is Negative. Fact Sheet for Patients: HairSlick.no Fact Sheet for Healthcare Providers: quierodirigir.com This test is not yet approved or cleared by the [place] FDA and  has been authorized for detection and/or diagnosis of SARS-CoV-2 by FDA under an Emergency Use Authorization (EUA). This EUA will remain  in effect (meaning this test can be used) for t he duration of the COVID-19 declaration under Section 564(b)(1) of the Act, 21 U.S.C. section 360bbb-3(b)(1), unless the authorization is terminated or revoked sooner. Performed at [facility], [address] [place], [place] [address]      Time  coordinating discharge: Over 40 minutes  SIGNED:   Marinda Elk, MD  [facility] 09/29/2019, 8:01 AM Pager   If 7PM-7AM, please contact night-coverage [email or web address] Password [email or web address]

## 2019-09-29 NOTE — Progress Notes (Signed)
SATURATION QUALIFICATIONS: (This note is used to comply with regulatory documentation for home oxygen)  Patient Saturations on Room Air at Rest = 95%  Patient Saturations on Room Air while Ambulating = 85%  Patient Saturations on 2 Liters of oxygen while Ambulating = 88%  Please briefly explain why patient needs home oxygen: Needs supplemental O2 with activity to maintain adequate oxygenation.  Georga Hacking [facility] PT Acute Rehabilitation Services Pager [phone] Office [phone]

## 2021-12-10 IMAGING — DX DG CHEST 1V PORT
1 series · 1 of 1 positions shown · non-contrast
Comparison: Chest x-ray dated September 24, 2019.

CLINICAL DATA: Dyspnea.  LAM2N-0D positive.

EXAM:
PORTABLE CHEST 1 VIEW

[chest]
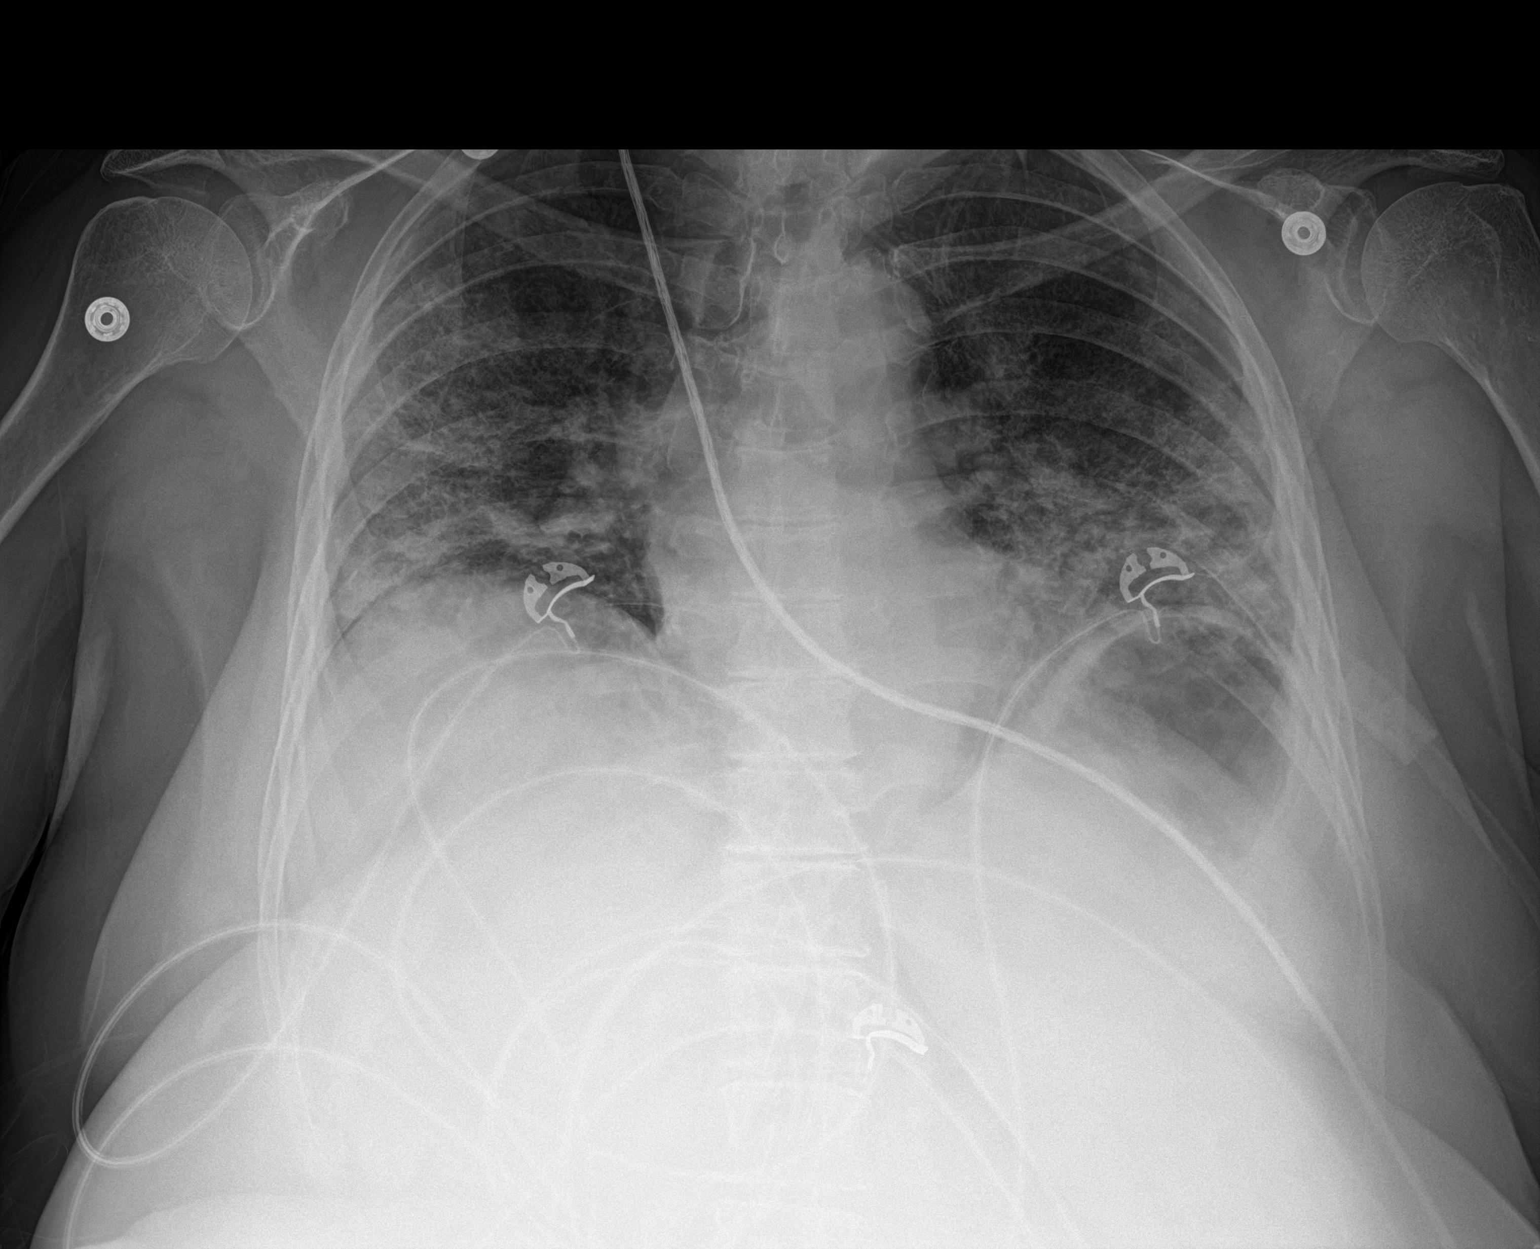

[1 of 1 positions shown; findings below may reference images not displayed]

FINDINGS: The heart size and mediastinal contours are within normal limits.
Normal pulmonary vascularity. Continued low lung volumes with
progressive patchy peripheral and basilar predominant interstitial
and airspace opacities in both lungs. No pleural effusion or
pneumothorax. No acute osseous abnormality.
IMPRESSION: 1. Worsened multifocal pneumonia.
